# Patient Record
Sex: Female | Born: 1980 | Race: White | Hispanic: No | Marital: Single | Smoking: Current every day smoker
Health system: Southern US, Community
[De-identification: ages and names within clinical notes are randomized; demographics above are authoritative.]

## PROBLEM LIST (undated history)

## (undated) DIAGNOSIS — F419 Anxiety disorder, unspecified: Secondary | ICD-10-CM

---

## 2006-06-05 ENCOUNTER — Emergency Department (HOSPITAL_COMMUNITY): Admission: EM | Admit: 2006-06-05 | Discharge: 2006-06-05 | Payer: Self-pay | Admitting: Family Medicine

## 2015-03-29 ENCOUNTER — Emergency Department (HOSPITAL_BASED_OUTPATIENT_CLINIC_OR_DEPARTMENT_OTHER): Payer: No Typology Code available for payment source

## 2015-03-29 ENCOUNTER — Encounter (HOSPITAL_BASED_OUTPATIENT_CLINIC_OR_DEPARTMENT_OTHER): Payer: Self-pay | Admitting: *Deleted

## 2015-03-29 ENCOUNTER — Emergency Department (HOSPITAL_BASED_OUTPATIENT_CLINIC_OR_DEPARTMENT_OTHER)
Admission: EM | Admit: 2015-03-29 | Discharge: 2015-03-29 | Disposition: A | Discharge status: Home / Self Care | Payer: No Typology Code available for payment source | Attending: Emergency Medicine | Admitting: Emergency Medicine

## 2015-03-29 DIAGNOSIS — S4992XA Unspecified injury of left shoulder and upper arm, initial encounter: Secondary | ICD-10-CM | POA: Insufficient documentation

## 2015-03-29 DIAGNOSIS — F419 Anxiety disorder, unspecified: Secondary | ICD-10-CM | POA: Insufficient documentation

## 2015-03-29 DIAGNOSIS — S20212A Contusion of left front wall of thorax, initial encounter: Secondary | ICD-10-CM | POA: Insufficient documentation

## 2015-03-29 DIAGNOSIS — S5012XA Contusion of left forearm, initial encounter: Secondary | ICD-10-CM | POA: Diagnosis not present

## 2015-03-29 DIAGNOSIS — Y9389 Activity, other specified: Secondary | ICD-10-CM | POA: Insufficient documentation

## 2015-03-29 DIAGNOSIS — Z720 Tobacco use: Secondary | ICD-10-CM | POA: Insufficient documentation

## 2015-03-29 DIAGNOSIS — S199XXA Unspecified injury of neck, initial encounter: Secondary | ICD-10-CM | POA: Insufficient documentation

## 2015-03-29 DIAGNOSIS — M542 Cervicalgia: Secondary | ICD-10-CM

## 2015-03-29 DIAGNOSIS — Y92410 Unspecified street and highway as the place of occurrence of the external cause: Secondary | ICD-10-CM | POA: Insufficient documentation

## 2015-03-29 DIAGNOSIS — Y998 Other external cause status: Secondary | ICD-10-CM | POA: Insufficient documentation

## 2015-03-29 DIAGNOSIS — S299XXA Unspecified injury of thorax, initial encounter: Secondary | ICD-10-CM | POA: Diagnosis present

## 2015-03-29 HISTORY — DX: Anxiety disorder, unspecified: F41.9

## 2015-03-29 MED ORDER — NAPROXEN 500 MG PO TABS: 500.0000 mg | ORAL_TABLET | Freq: Two times a day (BID) | ORAL | Status: AC

## 2015-03-29 MED ORDER — CYCLOBENZAPRINE HCL 5 MG PO TABS: 5.0000 mg | ORAL_TABLET | Freq: Three times a day (TID) | ORAL | Status: AC | PRN
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
## 2015-03-29 NOTE — Discharge Instructions (Signed)
Return to the ED with any concerns including difficulty breathing, weakness of arms or legs, abdominal pain, vomiting, decreased level of alertness/lethargy, or any other alarming symptoms °
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
